# Patient Record
Sex: Female | Born: 1970 | Race: White | Hispanic: No | Marital: Married | Smoking: Never smoker
Health system: Southern US, Community
[De-identification: ages and names within clinical notes are randomized; demographics above are authoritative.]

## PROBLEM LIST (undated history)

## (undated) HISTORY — PX: CHOLECYSTECTOMY: SHX55

## (undated) HISTORY — PX: TUBAL LIGATION: SHX77

---

## 2005-05-25 ENCOUNTER — Ambulatory Visit: Payer: Self-pay | Admitting: Obstetrics and Gynecology

## 2005-09-17 ENCOUNTER — Ambulatory Visit: Payer: Self-pay | Admitting: Gastroenterology

## 2005-10-04 ENCOUNTER — Ambulatory Visit: Payer: Self-pay | Admitting: Gastroenterology

## 2007-09-16 ENCOUNTER — Ambulatory Visit: Payer: Self-pay | Admitting: Gastroenterology

## 2007-09-30 ENCOUNTER — Ambulatory Visit: Payer: Self-pay | Admitting: Unknown Physician Specialty

## 2007-10-28 ENCOUNTER — Ambulatory Visit: Payer: Self-pay | Admitting: Unknown Physician Specialty

## 2007-11-04 ENCOUNTER — Ambulatory Visit: Payer: Self-pay | Admitting: Unknown Physician Specialty

## 2008-05-01 ENCOUNTER — Ambulatory Visit: Payer: Self-pay | Admitting: Internal Medicine

## 2008-08-10 ENCOUNTER — Ambulatory Visit: Payer: Self-pay | Admitting: Obstetrics and Gynecology

## 2008-12-21 ENCOUNTER — Ambulatory Visit: Payer: Self-pay | Admitting: Gastroenterology

## 2009-03-05 ENCOUNTER — Ambulatory Visit: Payer: Self-pay | Admitting: Family Medicine

## 2009-08-15 ENCOUNTER — Ambulatory Visit: Payer: Self-pay | Admitting: Obstetrics and Gynecology

## 2009-09-07 ENCOUNTER — Ambulatory Visit: Payer: Self-pay | Admitting: Gastroenterology

## 2010-09-21 ENCOUNTER — Ambulatory Visit: Payer: Self-pay | Admitting: Obstetrics and Gynecology

## 2010-12-18 ENCOUNTER — Ambulatory Visit: Payer: Self-pay | Admitting: Otolaryngology

## 2011-09-26 ENCOUNTER — Ambulatory Visit: Payer: Self-pay | Admitting: Obstetrics and Gynecology

## 2011-10-17 ENCOUNTER — Ambulatory Visit: Payer: Self-pay | Admitting: Surgery

## 2011-10-17 LAB — BASIC METABOLIC PANEL
BUN: 10 mg/dL (ref 7–18)
Chloride: 105 mmol/L (ref 98–107)
Co2: 28 mmol/L (ref 21–32)
Creatinine: 0.7 mg/dL (ref 0.60–1.30)
EGFR (Non-African Amer.): >60
Glucose: 74 mg/dL (ref 65–99)
Osmolality: 277 (ref 275–301)

## 2011-10-23 ENCOUNTER — Ambulatory Visit: Payer: Self-pay | Admitting: Surgery

## 2011-10-24 LAB — PATHOLOGY REPORT

## 2012-09-18 ENCOUNTER — Ambulatory Visit: Payer: Self-pay | Admitting: Surgery

## 2012-09-25 ENCOUNTER — Ambulatory Visit: Payer: Self-pay | Admitting: Surgery

## 2012-11-13 ENCOUNTER — Ambulatory Visit: Payer: Self-pay | Admitting: Obstetrics and Gynecology

## 2013-03-16 ENCOUNTER — Ambulatory Visit: Payer: Self-pay

## 2013-03-16 LAB — COMPREHENSIVE METABOLIC PANEL
Alkaline Phosphatase: 71 U/L (ref 50–136)
Anion Gap: 9 (ref 7–16)
Bilirubin,Total: 0.2 mg/dL (ref 0.2–1.0)
Chloride: 103 mmol/L (ref 98–107)
Co2: 29 mmol/L (ref 21–32)
Creatinine: 0.84 mg/dL (ref 0.60–1.30)
EGFR (Non-African Amer.): >60
Osmolality: 280 (ref 275–301)
Potassium: 3.9 mmol/L (ref 3.5–5.1)
Sodium: 141 mmol/L (ref 136–145)
Total Protein: 7.7 g/dL (ref 6.4–8.2)

## 2013-03-16 LAB — URINALYSIS, COMPLETE
Glucose,UR: NEGATIVE mg/dL (ref 0–75)
Nitrite: NEGATIVE
Ph: 6.5 (ref 4.5–8.0)
Protein: NEGATIVE
RBC,UR: NONE SEEN /HPF (ref 0–5)
Specific Gravity: 1.005 (ref 1.003–1.030)

## 2013-03-16 LAB — AMYLASE: Amylase: 106 U/L (ref 25–115)

## 2013-03-16 LAB — CBC WITH DIFFERENTIAL/PLATELET
Basophil #: 0 10*3/uL (ref 0.0–0.1)
Basophil %: 0.6 %
Eosinophil %: 3.7 %
HCT: 41.4 % (ref 35.0–47.0)
HGB: 13.9 g/dL (ref 12.0–16.0)
Lymphocyte #: 2.3 10*3/uL (ref 1.0–3.6)
Lymphocyte %: 30.8 %
MCH: 31.1 pg (ref 26.0–34.0)
MCHC: 33.6 g/dL (ref 32.0–36.0)
MCV: 93 fL (ref 80–100)
Monocyte #: 0.5 x10 3/mm (ref 0.2–0.9)
Neutrophil #: 4.3 10*3/uL (ref 1.4–6.5)
Neutrophil %: 58.4 %
Platelet: 290 10*3/uL (ref 150–440)
RBC: 4.47 10*6/uL (ref 3.80–5.20)
RDW: 12.7 % (ref 11.5–14.5)

## 2013-03-16 LAB — OCCULT BLOOD X 1 CARD TO LAB, STOOL: Occult Blood, Feces: NEGATIVE

## 2013-03-16 LAB — LIPASE, BLOOD: Lipase: 142 U/L (ref 73–393)

## 2013-03-18 LAB — URINE CULTURE

## 2013-03-20 ENCOUNTER — Ambulatory Visit: Payer: Self-pay | Admitting: Gastroenterology

## 2013-03-30 ENCOUNTER — Ambulatory Visit: Payer: Self-pay | Admitting: Gastroenterology

## 2013-07-01 ENCOUNTER — Ambulatory Visit: Payer: Self-pay | Admitting: Gastroenterology

## 2013-07-08 ENCOUNTER — Ambulatory Visit: Payer: Self-pay | Admitting: Gastroenterology

## 2013-08-11 ENCOUNTER — Ambulatory Visit: Payer: Self-pay | Admitting: Surgery

## 2013-08-13 LAB — PATHOLOGY REPORT

## 2013-12-17 ENCOUNTER — Ambulatory Visit: Payer: Self-pay | Admitting: Obstetrics and Gynecology

## 2014-09-24 NOTE — Op Note (Signed)
PATIENT NAME:  Angela [name], Angela [name] MR#:  [id number] DATE OF BIRTH:  [birth year]  DATE OF PROCEDURE:  09/25/2012  PREOPERATIVE DIAGNOSIS: Chronic anal fissure with chronic constipation.   POSTOPERATIVE DIAGNOSIS:   Chronic anal fissure with chronic constipation.  PROCEDURE: Lateral internal anal sphincterotomy.   SURGEON:  Renda RollsWilton Traci Gafford, M.D.   ANESTHESIA: General.   INDICATIONS: This 44 year old female has [name] of chronic constipation and chronic posterior anal fissure and surgery was recommended for definitive treatment.   DESCRIPTION OF PROCEDURE: The patient was placed on the operating table in the supine position under general anesthesia. The legs were elevated into the lithotomy position using ankle straps. The anal area was prepared with Betadine solution and draped with sterile towels and sheets.   The external anal appearance was typical.  The anoderm on the patient's left side at the 3 o'clock position was infiltrated with 0.5% Sensorcaine with epinephrine and also the deeper tissues surrounding the sphincter on the patient's left side were infiltrated as well. Next, the anal canal was dilated large enough to admit 3 fingers. The bivalve anal retractor was introduced and further gently dilated the anal sphincter.  Inspection revealed there were was just some small internal hemorrhoids. No neoplasm was seen. There was [name] posterior anal fissure. Next, an incision was made at the 3 o'clock position, approximately 12 mm in length, carried down through subcutaneous tissues using hemostat for blunt dissection and identified the internal anal sphincter. I recognized its white color.  It did appear to be thickened and this was incised and cut across 90% of the internal anal sphincter. Hemostasis subsequently appeared to be intact.   The wound was closed with [name] 5-0 chromic simple sutures leaving an opening distally for drainage. Dressings were applied with paper tape. The patient tolerated surgery  satisfactorily and was prepared for transfer to the recovery room.      ____________________________ Shela CommonsJ. Renda RollsWilton Leonilda Cozby, MD jws:ct D: 09/25/2012 10:59:46 ET T: 09/25/2012 11:42:51 ET JOB#: [id number]  cc: Adella HareJ. Wilton Permelia Bamba, MD, <Dictator> Adella HareWILTON J [name] MD ELECTRONICALLY SIGNED 09/25/2012 17:26

## 2014-09-25 NOTE — Op Note (Signed)
PATIENT NAME:  Angela [name], Angela [name] MR#:  [id number] DATE OF BIRTH:  [birth year]  DATE OF PROCEDURE:  08/11/2013  PREOPERATIVE DIAGNOSIS: Chronic acalculous cholecystitis.   POSTOPERATIVE DIAGNOSIS:  Chronic acalculous cholecystitis.  PROCEDURE: Laparoscopic cholecystectomy, cholangiogram.   SURGEON: Renda RollsWilton Correll Denbow, MD.   ANESTHESIA: General.   INDICATIONS FOR PROCEDURE: This 44 year old female has [name] of intermittent right upper quadrant abdominal pains. Ultrasound showed no gallstones. Hepatobiliary scan was done, which had [name] normal gallbladder ejection fraction. However, she had exact reproduction of her pain with injection of cholecystokinin and surgery was recommended for definitive treatment.   DESCRIPTION OF PROCEDURE: The patient was placed on the operating table in the supine position under general endotracheal anesthesia. The abdomen was prepared with ChloraPrep, draped in [name] sterile manner.  [name] short incision was made in the inferior aspect of the umbilicus and carried down to the deep fascia which was grasped with laryngeal hook and elevated. [name] Veress needle was inserted, aspirated and irrigated with [name] saline solution. Next, the peritoneal cavity was inflated with carbon dioxide. The Veress needle was removed. The 10 mm cannula was inserted. [name] 10 mm, 0 degree laparoscope was inserted to view the peritoneal cavity. The liver appeared normal. Visible intestines appeared typical. Another incision was made in the epigastrium slightly to the right of the midline to introduce an 11 mm cannula. Two incisions were made in the lateral aspect of the right upper quadrant to introduce two 5 mm cannulas.  With the patient in the reversed Trendelenburg and turned several degrees to the left, the gallbladder was retracted towards the right shoulder. Multiple adhesions were taken down from the gallbladder with blunt dissection and use of electrocautery. The gallbladder neck was further mobilized. The  site of the porta hepatis was demonstrated. The cystic duct was dissected free from surrounding structures. The cystic artery was dissected free from surrounding structures. [name] critical view of safety was demonstrated. An Endo Clip was placed across the cystic duct adjacent to the neck of the gallbladder. An incision was made in the cystic duct to introduce [name] Reddick catheter. Half-strength Conray-60 dye was injected as the cholangiogram was done with fluoroscopy. Viewing the biliary tree and flow of dye into the duodenum, no retained stones were seen. The Reddick catheter was removed. The cystic duct was doubly ligated with Endo Clips and divided. The cystic artery was controlled with double Endo Clips and divided. The gallbladder was dissected free from the liver with hook and cautery. Bleeding was minimal. Hemostasis subsequently intact. The gallbladder was delivered up through the infraumbilical incision, opened and suctioned, removed and submitted for routine pathology. The right upper quadrant was further inspected. Hemostasis was intact. The cannulas were removed. Carbon dioxide allowed to escape from the peritoneal cavity. Skin incisions were closed with interrupted 5-0 chromic subcuticular sutures, benzoin and Steri-Strips. Dressings were applied with paper tape. The patient tolerated surgery satisfactorily and was prepared for transfer to the recovery room.  ____________________________ Shela CommonsJ. Renda RollsWilton Lugene Beougher, MD jws:ce D: 08/11/2013 15:10:54 ET T: 08/11/2013 15:43:44 ET JOB#: [id number]  cc: Adella HareJ. Wilton Jozee Hammer, MD, <Dictator> Adella HareWILTON J Abdi Husak MD ELECTRONICALLY SIGNED 08/19/2013 17:46

## 2014-09-26 NOTE — Op Note (Signed)
PATIENT NAME:  Angela [name], Ger A MR#:  [id number] DATE OF BIRTH:  1970-09-03  DATE OF PROCEDURE:  10/23/2011  PREOPERATIVE DIAGNOSIS: Hemorrhoids.   POSTOPERATIVE DIAGNOSIS: Hemorrhoids.   PROCEDURE: Hemorrhoidectomy.   SURGEON: Renda RollsWilton Smith, MD   ANESTHESIA: General.   INDICATIONS: This 44 year old female has a history of anal pain, constipation, swelling, and previous internal hemorrhoid rubber band ligation. She had physical findings of internal and external hemorrhoids and due to the magnitude of symptoms surgery is recommended for definitive treatment.   DESCRIPTION OF PROCEDURE: The patient was placed on the operating table in the supine position under general anesthesia. The legs were elevated into the lithotomy position using ankle straps. The anal area was prepared with Betadine and draped in a sterile manner. The largest hemorrhoid was found at 4 to 5 o'clock position, and the next largest hemorrhoid was found at the 8 o'clock position There was also a prominent tag which was at 1 o'clock position. The anoderm was infiltrated with 0.5% Sensorcaine with epinephrine. The anal canal was dilated large enough to admit three fingers. The bivalve anal retractor was introduced. The distal rectal mucosa appeared normal. No neoplasm was seen. There was a large internal hemorrhoid adjacent to the external hemorrhoid at the 4 to 5 o'clock position. There was also a large internal hemorrhoid at the 8 o'clock position.   The internal and external hemorrhoid at the 4 to 5 o'clock position was removed first. A high ligation of the internal component was done with a 3-0 chromic suture ligature. The external portion was scored with a scalpel with a V-shaped incision. Next, scissors were used to dissect the external hemorrhoids away from the surrounding subcutaneous tissues, and this dissection was carried up over the internal sphincter; and subsequently I used electrocautery for additional dissection and  the dissection was carried out to the previously placed suture ligature. The hemorrhoid was further ligated with the same ligature and excised. The wound was inspected. Several small bleeding points were cauterized. The wound was closed with a running locked tied 3-0 chromic suture leaving a small opening externally for drainage.   Next, a similar procedure was carried out at the 8 o'clock position removing internal and external hemorrhoid and also using 3-0 chromic for ligature and closure. Next, another small internal hemorrhoid which was posterior was elevated with a Kelly clamp and ligated with 3-0 chromic. Next, there was an external hemorrhoidal tag which was anterior at the 1 o'clock position, and this was removed with the scissors and closed with a single 3-0 chromic suture.   The site was prepared with Betadine solution again, and I then infiltrated additional 0.5% Sensorcaine with epinephrine. Dressings were applied with paper tape. The patient tolerated surgery satisfactorily and was then prepared for transfer to the recovery room.    ____________________________ Shela CommonsJ. Renda RollsWilton Smith, MD jws:cbb D: 10/23/2011 08:38:39 ET T: 10/23/2011 10:13:34 ET JOB#: [id number]  cc: Adella HareJ. Wilton Smith, MD, <Dictator> Adella HareWILTON J SMITH MD ELECTRONICALLY SIGNED 10/29/2011 11:13

## 2015-01-10 ENCOUNTER — Other Ambulatory Visit: Payer: Self-pay | Admitting: Obstetrics and Gynecology

## 2015-01-10 DIAGNOSIS — Z1231 Encounter for screening mammogram for malignant neoplasm of breast: Secondary | ICD-10-CM

## 2015-01-12 ENCOUNTER — Ambulatory Visit
Admission: RE | Admit: 2015-01-12 | Discharge: 2015-01-12 | Disposition: A | Discharge status: Home / Self Care | Payer: BC Managed Care – PPO | Source: Ambulatory Visit | Attending: Obstetrics and Gynecology | Admitting: Obstetrics and Gynecology

## 2015-01-12 DIAGNOSIS — Z1231 Encounter for screening mammogram for malignant neoplasm of breast: Secondary | ICD-10-CM | POA: Diagnosis not present

## 2015-09-09 ENCOUNTER — Ambulatory Visit: Payer: BC Managed Care – PPO | Attending: Internal Medicine

## 2015-09-09 DIAGNOSIS — G4733 Obstructive sleep apnea (adult) (pediatric): Secondary | ICD-10-CM | POA: Insufficient documentation

## 2015-10-21 ENCOUNTER — Ambulatory Visit: Payer: BC Managed Care – PPO | Attending: Internal Medicine

## 2015-10-21 DIAGNOSIS — G4733 Obstructive sleep apnea (adult) (pediatric): Secondary | ICD-10-CM | POA: Insufficient documentation

## 2016-02-08 ENCOUNTER — Other Ambulatory Visit: Payer: Self-pay | Admitting: Obstetrics and Gynecology

## 2016-02-08 DIAGNOSIS — Z1231 Encounter for screening mammogram for malignant neoplasm of breast: Secondary | ICD-10-CM

## 2016-02-21 ENCOUNTER — Ambulatory Visit: Admission: RE | Admit: 2016-02-21 | Payer: BC Managed Care – PPO | Source: Ambulatory Visit

## 2016-03-01 ENCOUNTER — Ambulatory Visit
Admission: RE | Admit: 2016-03-01 | Discharge: 2016-03-01 | Disposition: A | Discharge status: Home / Self Care | Payer: BC Managed Care – PPO | Source: Ambulatory Visit | Attending: Obstetrics and Gynecology | Admitting: Obstetrics and Gynecology

## 2016-03-01 ENCOUNTER — Other Ambulatory Visit: Payer: Self-pay | Admitting: Obstetrics and Gynecology

## 2016-03-01 DIAGNOSIS — Z1231 Encounter for screening mammogram for malignant neoplasm of breast: Secondary | ICD-10-CM | POA: Diagnosis present

## 2016-10-07 ENCOUNTER — Ambulatory Visit
Admission: EM | Admit: 2016-10-07 | Discharge: 2016-10-07 | Disposition: A | Discharge status: Home / Self Care | Payer: BC Managed Care – PPO | Attending: Family Medicine | Admitting: Family Medicine

## 2016-10-07 ENCOUNTER — Encounter: Payer: Self-pay | Admitting: Emergency Medicine

## 2016-10-07 DIAGNOSIS — J0410 Acute tracheitis without obstruction: Secondary | ICD-10-CM | POA: Diagnosis not present

## 2016-10-07 DIAGNOSIS — J040 Acute laryngitis: Secondary | ICD-10-CM

## 2016-10-07 MED ORDER — FEXOFENADINE-PSEUDOEPHED ER 180-240 MG PO TB24: 1.0000 | ORAL_TABLET | Freq: Every day | ORAL | 0 refills | Status: AC

## 2016-10-07 MED ORDER — HYDROCOD POLST-CPM POLST ER 10-8 MG/5ML PO SUER: 5.0000 mL | Freq: Two times a day (BID) | ORAL | 0 refills | Status: AC | PRN

## 2016-10-07 MED ORDER — AZITHROMYCIN 250 MG PO TABS: ORAL_TABLET | ORAL | 0 refills | Status: AC

## 2016-10-07 NOTE — ED Triage Notes (Signed)
Patient c/o cough, chest congestion and loss voice since Thursday.

## 2016-10-07 NOTE — ED Provider Notes (Signed)
[facility]    CSN: [id number] Arrival date & time: 10/07/16  1347     History   Chief Complaint Chief Complaint  Patient presents with  . Cough    HPI Angela [name] is a 46 y.o. female.   HPI  History reviewed. No pertinent past medical history.  There are no active problems to display for this patient.   Past Surgical History:  Procedure Laterality Date  . CESAREAN SECTION    . CHOLECYSTECTOMY    . TUBAL LIGATION      OB History    No data available       Home Medications    Prior to Admission medications   Medication Sig Start Date End Date Taking? Authorizing Provider  Dexlansoprazole (DEXILANT PO) Take by mouth.   Yes [provider]  estradiol (ESTRACE) 0.5 MG tablet Take 0.5 mg by mouth daily.   Yes [provider]  azithromycin (ZITHROMAX Z-PAK) 250 MG tablet Take 2 tablets first day and then 1 po a day for 4 days 10/07/16   Hassan Rowan, MD  chlorpheniramine-HYDROcodone [facility] PENNKINETIC ER) 10-8 MG/5ML SUER Take 5 mLs by mouth every 12 (twelve) hours as needed. 10/07/16   Hassan Rowan, MD  fexofenadine-pseudoephedrine (ALLEGRA-D ALLERGY & CONGESTION) 180-240 MG 24 hr tablet Take 1 tablet by mouth daily. 10/07/16   Hassan Rowan, MD    Family History Family History  Problem Relation Age of Onset  . Breast cancer Cousin 50    mat cousin    Social History Social History  Substance Use Topics  . Smoking status: Never Smoker  . Smokeless tobacco: Never Used  . Alcohol use Yes     Allergies   Patient has no known allergies.   Review of Systems Review of Systems   Physical Exam Triage Vital Signs ED Triage Vitals  Enc Vitals Group     BP 10/07/16 1423 (!) 145/79     Pulse Rate 10/07/16 1423 77     Resp 10/07/16 1423 16     Temp 10/07/16 1423 98 F (36.7 C)     Temp Source 10/07/16 1423 Oral     SpO2 10/07/16 1423 98 %     Weight 10/07/16 1420 160 lb (72.6 kg)     Height 10/07/16 1420 5\' 2"  (1.575  m)     Head Circumference --      Peak Flow --      Pain Score 10/07/16 1421 0     Pain Loc --      Pain Edu? --      Excl. in GC? --    No data found.   Updated Vital Signs BP (!) 145/79 (BP Location: Left Arm)   Pulse 77   Temp 98 F (36.7 C) (Oral)   Resp 16   Ht 5\' 2"  (1.575 m)   Wt 160 lb (72.6 kg)   SpO2 98%   BMI 29.26 kg/m   Visual Acuity Right Eye Distance:   Left Eye Distance:   Bilateral Distance:    Right Eye Near:   Left Eye Near:    Bilateral Near:     Physical Exam  Constitutional: She is oriented to person, place, and time. She appears well-developed and well-nourished.  HENT:  Head: Normocephalic and atraumatic.  Right Ear: Hearing, tympanic membrane, external ear and ear canal normal.  Left Ear: Hearing, tympanic membrane, external ear and ear canal normal.  Nose: No sinus tenderness or nasal deformity. Right sinus  exhibits no maxillary sinus tenderness and no frontal sinus tenderness. Left sinus exhibits no maxillary sinus tenderness and no frontal sinus tenderness.  Mouth/Throat: Uvula is midline and mucous membranes are normal. No oral lesions. No uvula swelling.  Eyes: Pupils are equal, round, and reactive to light.  Neck: Normal range of motion. Neck supple.  Pulmonary/Chest: Effort normal and breath sounds normal. No respiratory distress.  Musculoskeletal: Normal range of motion.  Lymphadenopathy:    She has cervical adenopathy.  Neurological: She is oriented to person, place, and time.  Skin: Skin is warm.  Psychiatric: She has a normal mood and affect.  Vitals reviewed.    UC Treatments / Results  Labs (all labs ordered are listed, but only abnormal results are displayed) Labs Reviewed - No data to display  EKG  EKG Interpretation None       Radiology No results found.  Procedures Procedures (including critical care time)  Medications Ordered in UC Medications - No data to display   Initial Impression / Assessment  and Plan / UC Course  I have reviewed the triage vital signs and the nursing notes.  Pertinent labs & imaging results that were available during my care of the patient were reviewed by me and considered in my medical decision making (see chart for details).     For the trach has laryngitis will place on Z-Pak, Tussionex 1 teaspoon twice a day and Allegra-D. Strongly recommend that she take tomorrow off to rest her voice follow-up with her PCP if not better in 1-2 weeks.  Final Clinical Impressions(s) / UC Diagnoses   Final diagnoses:  Laryngitis  Tracheitis    New Prescriptions New Prescriptions   AZITHROMYCIN (ZITHROMAX Z-PAK) 250 MG TABLET    Take 2 tablets first day and then 1 po a day for 4 days   CHLORPHENIRAMINE-HYDROCODONE (TUSSIONEX PENNKINETIC ER) 10-8 MG/5ML SUER    Take 5 mLs by mouth every 12 (twelve) hours as needed.   FEXOFENADINE-PSEUDOEPHEDRINE (ALLEGRA-D ALLERGY & CONGESTION) 180-240 MG 24 HR TABLET    Take 1 tablet by mouth daily.    Note: This dictation was prepared with Dragon dictation along with smaller phrase technology. Any transcriptional errors that result from this process are unintentional.   Hassan RowanWade, Ruthanne Mcneish, MD 10/07/16 [phone]

## 2017-02-28 ENCOUNTER — Other Ambulatory Visit: Payer: Self-pay | Admitting: Obstetrics and Gynecology

## 2017-02-28 DIAGNOSIS — Z1231 Encounter for screening mammogram for malignant neoplasm of breast: Secondary | ICD-10-CM

## 2017-04-09 ENCOUNTER — Ambulatory Visit: Payer: BC Managed Care – PPO

## 2017-04-17 ENCOUNTER — Ambulatory Visit
Admission: RE | Admit: 2017-04-17 | Discharge: 2017-04-17 | Disposition: A | Discharge status: Home / Self Care | Payer: BC Managed Care – PPO | Source: Ambulatory Visit | Attending: Obstetrics and Gynecology | Admitting: Obstetrics and Gynecology

## 2017-04-17 DIAGNOSIS — Z1231 Encounter for screening mammogram for malignant neoplasm of breast: Secondary | ICD-10-CM

## 2018-03-10 ENCOUNTER — Other Ambulatory Visit: Payer: Self-pay | Admitting: Obstetrics and Gynecology

## 2018-03-10 DIAGNOSIS — Z1231 Encounter for screening mammogram for malignant neoplasm of breast: Secondary | ICD-10-CM

## 2018-04-21 ENCOUNTER — Ambulatory Visit: Payer: BC Managed Care – PPO

## 2018-06-03 ENCOUNTER — Ambulatory Visit: Payer: BC Managed Care – PPO

## 2018-06-10 ENCOUNTER — Encounter (INDEPENDENT_AMBULATORY_CARE_PROVIDER_SITE_OTHER): Payer: Self-pay

## 2018-06-10 ENCOUNTER — Ambulatory Visit
Admission: RE | Admit: 2018-06-10 | Discharge: 2018-06-10 | Disposition: A | Discharge status: Home / Self Care | Payer: BC Managed Care – PPO | Source: Ambulatory Visit | Attending: Obstetrics and Gynecology | Admitting: Obstetrics and Gynecology

## 2018-06-10 DIAGNOSIS — Z1231 Encounter for screening mammogram for malignant neoplasm of breast: Secondary | ICD-10-CM

## 2018-06-16 ENCOUNTER — Other Ambulatory Visit: Payer: Self-pay | Admitting: Obstetrics and Gynecology

## 2018-06-16 DIAGNOSIS — R928 Other abnormal and inconclusive findings on diagnostic imaging of breast: Secondary | ICD-10-CM

## 2018-06-16 DIAGNOSIS — N6310 Unspecified lump in the right breast, unspecified quadrant: Secondary | ICD-10-CM

## 2018-06-17 ENCOUNTER — Ambulatory Visit
Admission: RE | Admit: 2018-06-17 | Discharge: 2018-06-17 | Disposition: A | Discharge status: Home / Self Care | Payer: BC Managed Care – PPO | Source: Ambulatory Visit | Attending: Obstetrics and Gynecology | Admitting: Obstetrics and Gynecology

## 2018-06-17 DIAGNOSIS — N6310 Unspecified lump in the right breast, unspecified quadrant: Secondary | ICD-10-CM | POA: Diagnosis present

## 2018-06-17 DIAGNOSIS — R928 Other abnormal and inconclusive findings on diagnostic imaging of breast: Secondary | ICD-10-CM | POA: Diagnosis present

## 2019-05-05 ENCOUNTER — Other Ambulatory Visit: Payer: Self-pay | Admitting: Obstetrics and Gynecology

## 2019-05-05 DIAGNOSIS — Z1231 Encounter for screening mammogram for malignant neoplasm of breast: Secondary | ICD-10-CM

## 2019-06-15 ENCOUNTER — Other Ambulatory Visit: Payer: Self-pay

## 2019-06-15 ENCOUNTER — Ambulatory Visit
Admission: RE | Admit: 2019-06-15 | Discharge: 2019-06-15 | Disposition: A | Discharge status: Home / Self Care | Payer: BC Managed Care – PPO | Source: Ambulatory Visit | Attending: Obstetrics and Gynecology | Admitting: Obstetrics and Gynecology

## 2019-06-15 DIAGNOSIS — Z1231 Encounter for screening mammogram for malignant neoplasm of breast: Secondary | ICD-10-CM | POA: Diagnosis present

## 2020-06-17 ENCOUNTER — Other Ambulatory Visit: Payer: Self-pay | Admitting: Obstetrics and Gynecology

## 2020-06-17 ENCOUNTER — Other Ambulatory Visit: Payer: Self-pay | Admitting: Family Medicine

## 2020-06-17 DIAGNOSIS — Z1231 Encounter for screening mammogram for malignant neoplasm of breast: Secondary | ICD-10-CM

## 2020-06-30 ENCOUNTER — Other Ambulatory Visit: Payer: Self-pay

## 2020-06-30 ENCOUNTER — Ambulatory Visit
Admission: RE | Admit: 2020-06-30 | Discharge: 2020-06-30 | Disposition: A | Discharge status: Home / Self Care | Payer: BC Managed Care – PPO | Source: Ambulatory Visit | Attending: Family Medicine | Admitting: Family Medicine

## 2020-06-30 DIAGNOSIS — Z1231 Encounter for screening mammogram for malignant neoplasm of breast: Secondary | ICD-10-CM | POA: Diagnosis present

## 2020-07-06 ENCOUNTER — Other Ambulatory Visit: Payer: Self-pay | Admitting: Family Medicine

## 2020-07-06 DIAGNOSIS — R928 Other abnormal and inconclusive findings on diagnostic imaging of breast: Secondary | ICD-10-CM

## 2020-07-06 DIAGNOSIS — R921 Mammographic calcification found on diagnostic imaging of breast: Secondary | ICD-10-CM

## 2020-07-06 DIAGNOSIS — N6489 Other specified disorders of breast: Secondary | ICD-10-CM

## 2020-07-07 ENCOUNTER — Ambulatory Visit
Admission: RE | Admit: 2020-07-07 | Discharge: 2020-07-07 | Disposition: A | Discharge status: Home / Self Care | Payer: BC Managed Care – PPO | Source: Ambulatory Visit | Attending: Family Medicine | Admitting: Family Medicine

## 2020-07-07 ENCOUNTER — Other Ambulatory Visit: Payer: Self-pay

## 2020-07-07 DIAGNOSIS — R928 Other abnormal and inconclusive findings on diagnostic imaging of breast: Secondary | ICD-10-CM | POA: Insufficient documentation

## 2020-07-07 DIAGNOSIS — R921 Mammographic calcification found on diagnostic imaging of breast: Secondary | ICD-10-CM | POA: Diagnosis present

## 2020-07-07 DIAGNOSIS — N6489 Other specified disorders of breast: Secondary | ICD-10-CM | POA: Diagnosis present

## 2021-11-16 IMAGING — MG MM DIGITAL DIAGNOSTIC UNILAT*L* W/ TOMO W/ CAD
7 series · 9 of 15 positions shown · non-contrast
Comparison: Previous exam(s).

CLINICAL DATA: Screening recall for a left breast asymmetry with
calcifications.

EXAM:
DIGITAL DIAGNOSTIC UNILATERAL LEFT MAMMOGRAM WITH TOMO AND CAD
TECHNIQUE: Left digital diagnostic mammography and breast tomosynthesis was
performed. Digital images of the breasts were evaluated with
computer-aided detection.

[L CC]
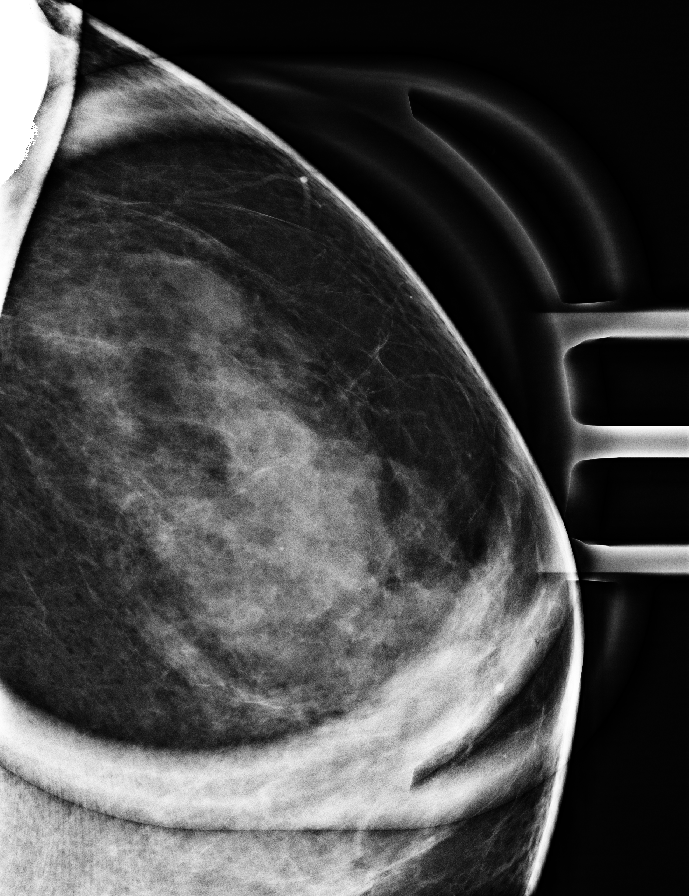

[L ML (1 of 2)]
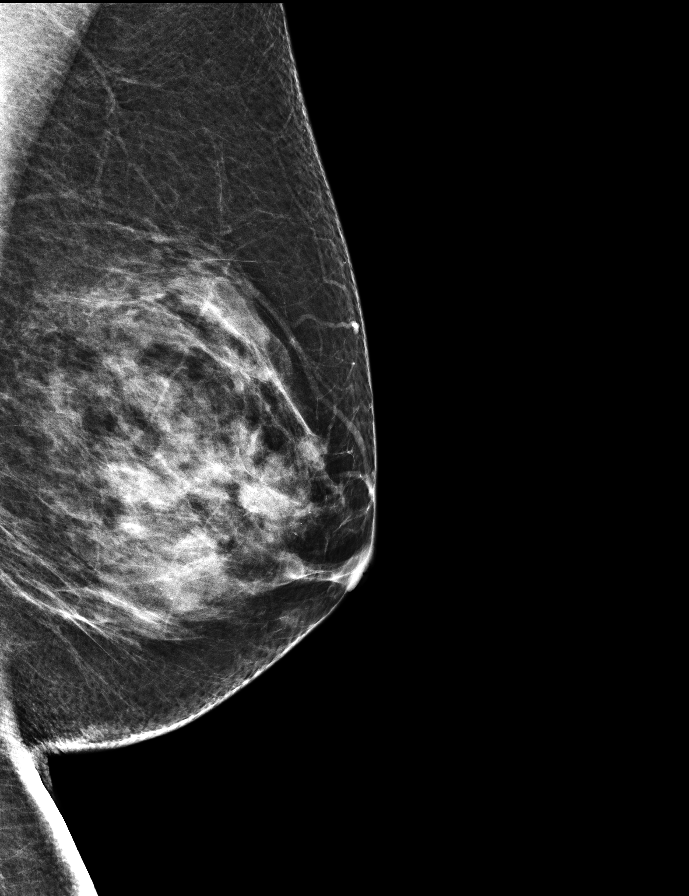

[L ML (2 of 2)]
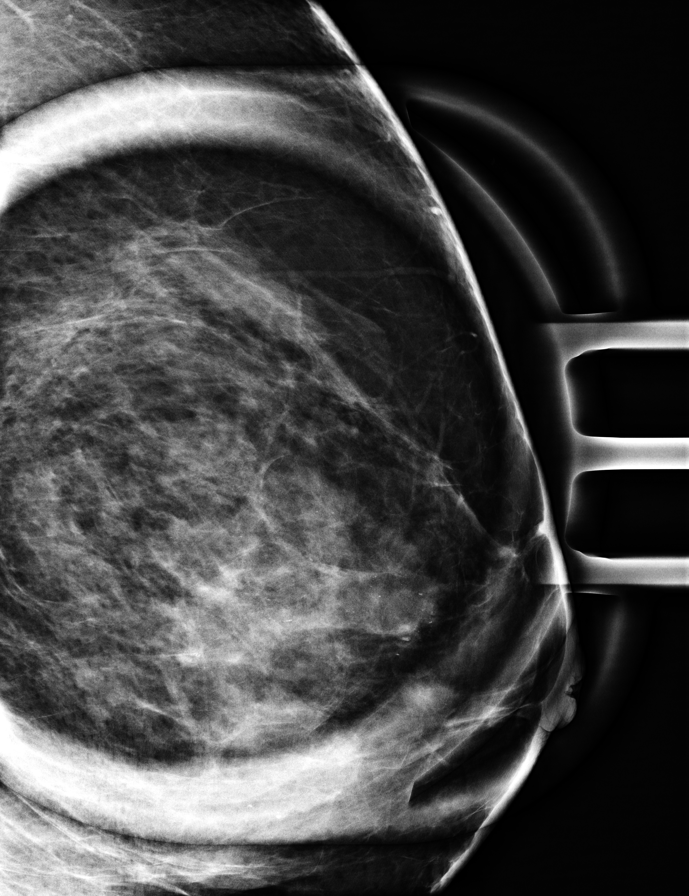

[L CC synth-2D]
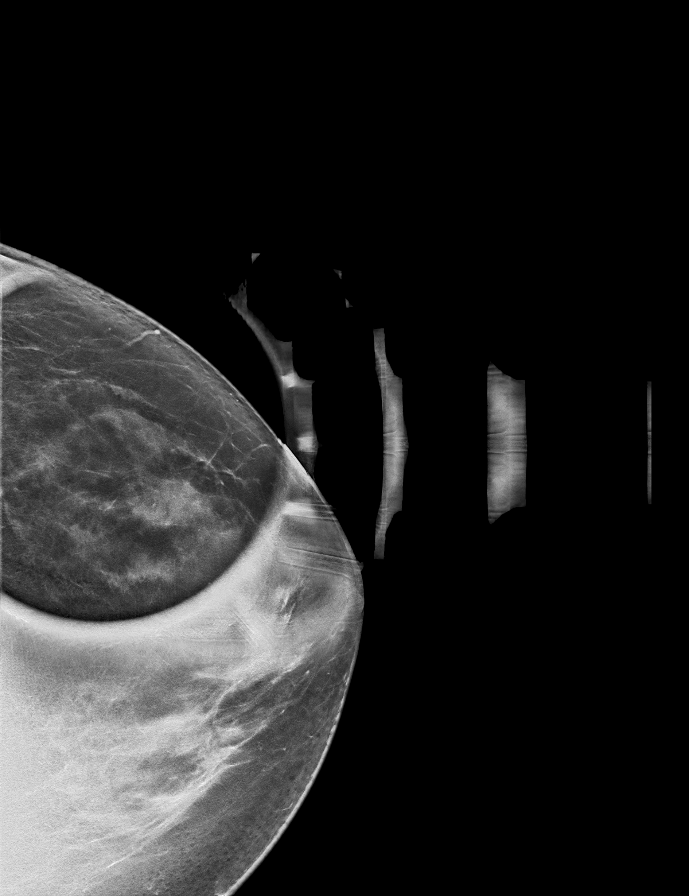

[L MLO synth-2D]
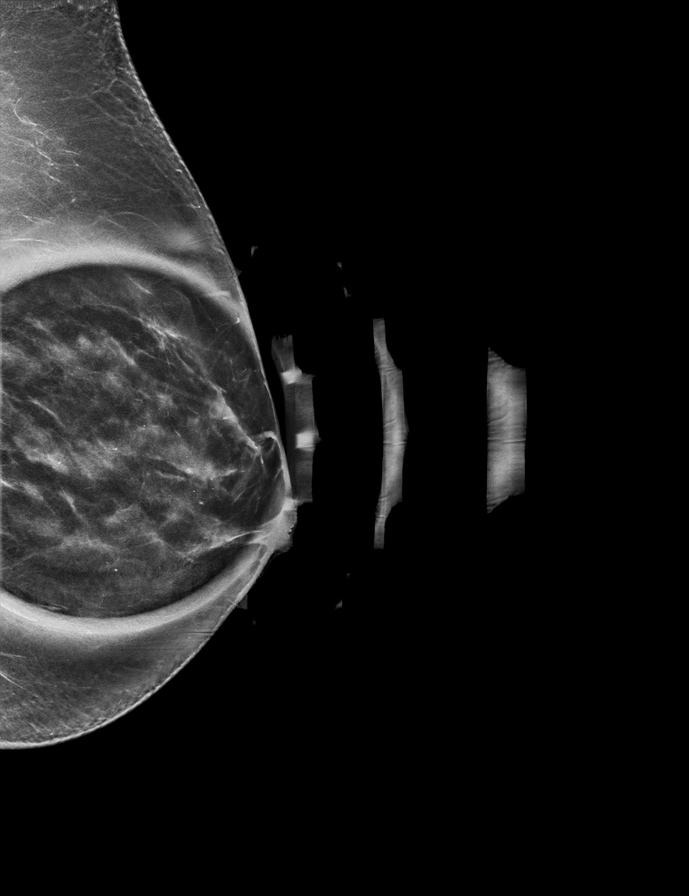

[L MLO tomo · 3 of 62 frames shown]
[frame 21/62]
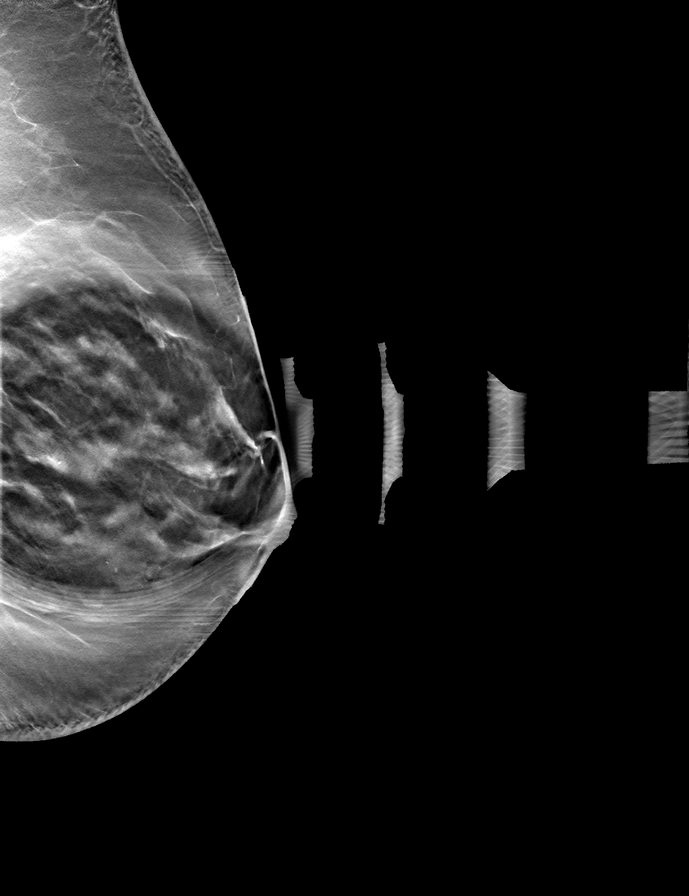
[frame 31/62]
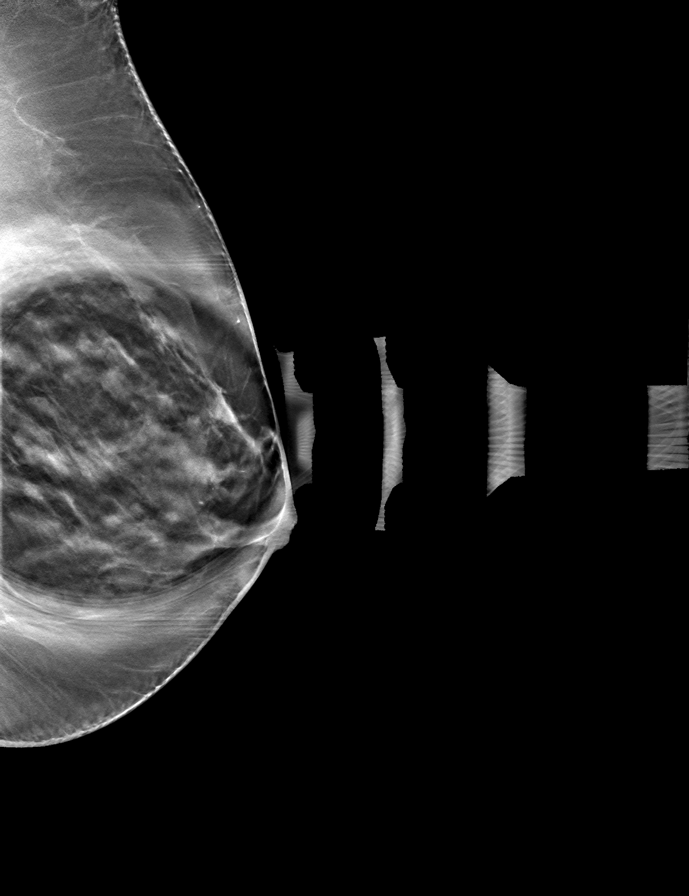
[frame 42/62]
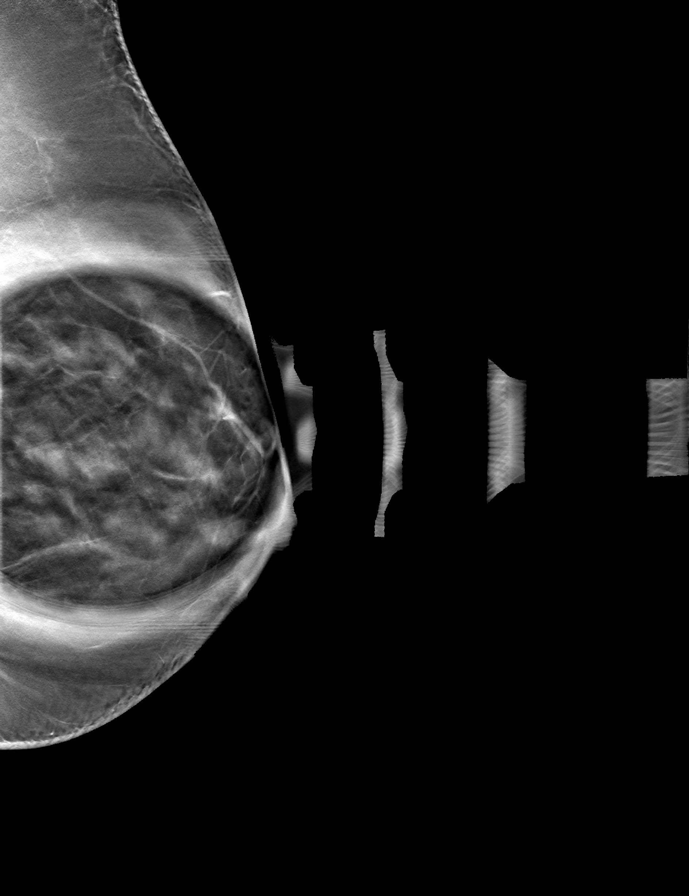

[L CC tomo · tomo slice 31/62.0]
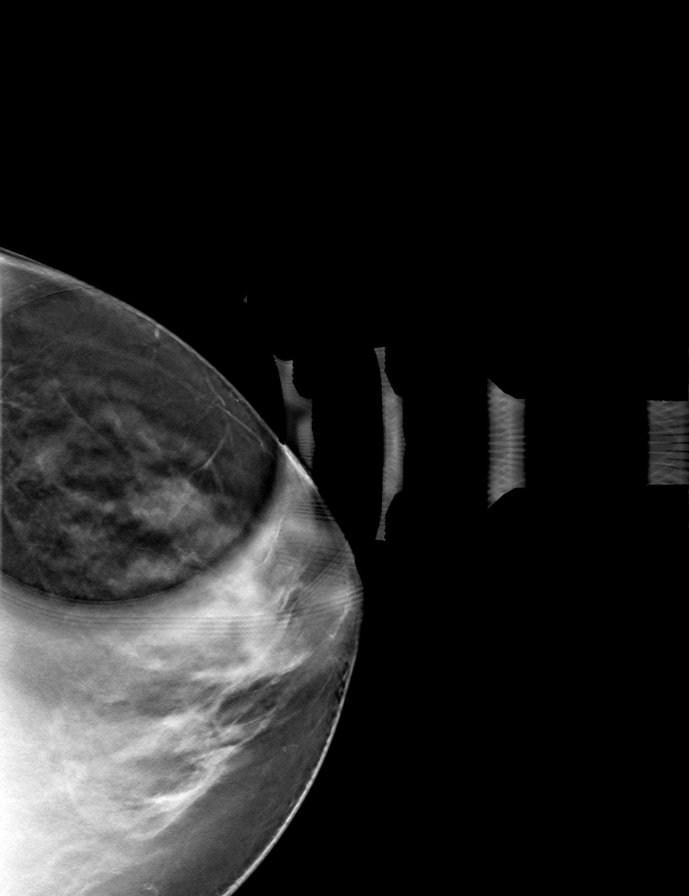

[9 of 15 positions shown; findings below may reference images not displayed]

ACR Breast Density Category c: The breast tissue is heterogeneously
dense, which may obscure small masses.
FINDINGS: Spot compression tomosynthesis images through the lateral left
breast demonstrates and asymmetry, stable from prior exams.
Calcifications layer on the true lateral view, and are consistent
with benign milk of calcium.
IMPRESSION: Stable asymmetry with associated benign milk of calcium in the
lateral left breast.

RECOMMENDATION:
Screening mammogram in one year.(Code:XK-P-FQF)

I have discussed the findings and recommendations with the patient.
If applicable, a reminder letter will be sent to the patient
regarding the next appointment.

BI-RADS CATEGORY  2: Benign.
